# Patient Record
Sex: Male | Born: 1935 | Race: White | Hispanic: No | Marital: Single
Health system: Southern US, Community
[De-identification: ages and names within clinical notes are randomized; demographics above are authoritative.]

---

## 1998-01-20 ENCOUNTER — Inpatient Hospital Stay (HOSPITAL_COMMUNITY): Admission: AD | Admit: 1998-01-20 | Discharge: 1998-01-31 | Payer: Self-pay | Admitting: Cardiothoracic Surgery

## 2000-06-14 ENCOUNTER — Encounter: Admission: RE | Admit: 2000-06-14 | Discharge: 2000-06-14 | Payer: Self-pay | Admitting: Cardiothoracic Surgery

## 2000-06-14 ENCOUNTER — Encounter: Payer: Self-pay | Admitting: Cardiothoracic Surgery

## 2001-01-24 ENCOUNTER — Encounter: Admission: RE | Admit: 2001-01-24 | Discharge: 2001-01-24 | Payer: Self-pay | Admitting: Cardiothoracic Surgery

## 2001-01-24 ENCOUNTER — Encounter: Payer: Self-pay | Admitting: Cardiothoracic Surgery

## 2002-01-30 ENCOUNTER — Encounter: Payer: Self-pay | Admitting: Cardiothoracic Surgery

## 2002-01-30 ENCOUNTER — Encounter: Admission: RE | Admit: 2002-01-30 | Discharge: 2002-01-30 | Payer: Self-pay | Admitting: Cardiothoracic Surgery

## 2004-07-17 ENCOUNTER — Ambulatory Visit: Payer: Self-pay | Admitting: Internal Medicine

## 2008-02-19 ENCOUNTER — Ambulatory Visit: Payer: Self-pay | Admitting: Dermatology

## 2011-02-20 ENCOUNTER — Ambulatory Visit: Payer: Self-pay | Admitting: Internal Medicine

## 2012-01-24 ENCOUNTER — Ambulatory Visit: Payer: Self-pay | Admitting: Internal Medicine

## 2012-02-11 ENCOUNTER — Ambulatory Visit: Payer: Self-pay | Admitting: Urology

## 2013-01-23 ENCOUNTER — Ambulatory Visit: Payer: Self-pay

## 2013-02-11 LAB — COMPREHENSIVE METABOLIC PANEL
Albumin: 3.7 g/dL (ref 3.4–5.0)
Alkaline Phosphatase: 83 U/L (ref 50–136)
Anion Gap: 10 (ref 7–16)
BUN: 30 mg/dL — ABNORMAL HIGH (ref 7–18)
Bilirubin,Total: 0.8 mg/dL (ref 0.2–1.0)
Calcium, Total: 9.4 mg/dL (ref 8.5–10.1)
Chloride: 84 mmol/L — ABNORMAL LOW (ref 98–107)
Co2: 24 mmol/L (ref 21–32)
Creatinine: 2.4 mg/dL — ABNORMAL HIGH (ref 0.60–1.30)
EGFR (African American): 27 — ABNORMAL LOW
EGFR (Non-African Amer.): 23 — ABNORMAL LOW
Glucose: 113 mg/dL — ABNORMAL HIGH (ref 65–99)
Osmolality: 245 (ref 275–301)
Potassium: 3.8 mmol/L (ref 3.5–5.1)
SGOT(AST): 35 U/L (ref 15–37)
SGPT (ALT): 20 U/L (ref 12–78)
Sodium: 118 mmol/L — CL (ref 136–145)
Total Protein: 9 g/dL — ABNORMAL HIGH (ref 6.4–8.2)

## 2013-02-11 LAB — URINALYSIS, COMPLETE
Bilirubin,UR: NEGATIVE
Glucose,UR: NEGATIVE mg/dL (ref 0–75)
Ketone: NEGATIVE
Nitrite: NEGATIVE
Ph: 6 (ref 4.5–8.0)
Protein: 100
RBC,UR: 2238 /HPF (ref 0–5)
Specific Gravity: 1.01 (ref 1.003–1.030)
Squamous Epithelial: NONE SEEN
WBC UR: 1855 /HPF (ref 0–5)

## 2013-02-11 LAB — CBC
HCT: 40 % (ref 40.0–52.0)
HGB: 13.6 g/dL (ref 13.0–18.0)
MCH: 28.5 pg (ref 26.0–34.0)
MCHC: 34 g/dL (ref 32.0–36.0)
MCV: 84 fL (ref 80–100)
Platelet: 339 10*3/uL (ref 150–440)
RBC: 4.77 10*6/uL (ref 4.40–5.90)
RDW: 13.7 % (ref 11.5–14.5)
WBC: 13.5 10*3/uL — ABNORMAL HIGH (ref 3.8–10.6)

## 2013-02-11 LAB — TROPONIN I: Troponin-I: 0.02 ng/mL

## 2013-02-11 LAB — CK TOTAL AND CKMB (NOT AT ARMC)
CK, Total: 496 U/L — ABNORMAL HIGH (ref 35–232)
CK-MB: 10.4 ng/mL — ABNORMAL HIGH (ref 0.5–3.6)

## 2013-02-12 ENCOUNTER — Inpatient Hospital Stay: Payer: Self-pay | Admitting: Internal Medicine

## 2013-02-12 LAB — BASIC METABOLIC PANEL
Anion Gap: 5 — ABNORMAL LOW (ref 7–16)
BUN: 26 mg/dL — ABNORMAL HIGH (ref 7–18)
Calcium, Total: 8.4 mg/dL — ABNORMAL LOW (ref 8.5–10.1)
Co2: 26 mmol/L (ref 21–32)
Creatinine: 1.47 mg/dL — ABNORMAL HIGH (ref 0.60–1.30)
EGFR (African American): 49 — ABNORMAL LOW
EGFR (Non-African Amer.): 42 — ABNORMAL LOW
Glucose: 106 mg/dL — ABNORMAL HIGH (ref 65–99)
Osmolality: 255 (ref 275–301)
Potassium: 4.6 mmol/L (ref 3.5–5.1)
Sodium: 124 mmol/L — ABNORMAL LOW (ref 136–145)

## 2013-02-12 LAB — TSH: Thyroid Stimulating Horm: 1.03 u[IU]/mL

## 2013-02-13 LAB — CBC WITH DIFFERENTIAL/PLATELET
Basophil %: 0.2 %
Eosinophil #: 0.1 10*3/uL (ref 0.0–0.7)
Eosinophil %: 0.9 %
HGB: 11.7 g/dL — ABNORMAL LOW (ref 13.0–18.0)
Lymphocyte #: 0.8 10*3/uL — ABNORMAL LOW (ref 1.0–3.6)
Monocyte #: 1.1 x10 3/mm — ABNORMAL HIGH (ref 0.2–1.0)
Monocyte %: 12.7 %
Neutrophil #: 6.5 10*3/uL (ref 1.4–6.5)
Platelet: 242 10*3/uL (ref 150–440)
RBC: 3.96 10*6/uL — ABNORMAL LOW (ref 4.40–5.90)
RDW: 14 % (ref 11.5–14.5)

## 2013-02-13 LAB — URINE CULTURE

## 2013-02-13 LAB — BASIC METABOLIC PANEL WITH GFR
Anion Gap: 9
BUN: 22 mg/dL — ABNORMAL HIGH
Calcium, Total: 8.7 mg/dL
Chloride: 99 mmol/L
Co2: 23 mmol/L
Creatinine: 1.41 mg/dL — ABNORMAL HIGH
EGFR (African American): 52 — ABNORMAL LOW
EGFR (Non-African Amer.): 44 — ABNORMAL LOW
Glucose: 79 mg/dL
Osmolality: 265
Potassium: 4.1 mmol/L
Sodium: 131 mmol/L — ABNORMAL LOW

## 2013-02-14 LAB — BASIC METABOLIC PANEL
BUN: 20 mg/dL — ABNORMAL HIGH (ref 7–18)
Calcium, Total: 8.7 mg/dL (ref 8.5–10.1)
Chloride: 102 mmol/L (ref 98–107)
Co2: 25 mmol/L (ref 21–32)
Creatinine: 1.26 mg/dL (ref 0.60–1.30)
EGFR (African American): 59 — ABNORMAL LOW
EGFR (Non-African Amer.): 51 — ABNORMAL LOW
Osmolality: 270 (ref 275–301)
Potassium: 4 mmol/L (ref 3.5–5.1)

## 2013-02-15 LAB — URINALYSIS, COMPLETE
RBC,UR: 6337 /HPF (ref 0–5)
Specific Gravity: 1.018 (ref 1.003–1.030)
Squamous Epithelial: 3
WBC UR: 850 /HPF (ref 0–5)

## 2013-02-16 LAB — BASIC METABOLIC PANEL
Anion Gap: 4 — ABNORMAL LOW (ref 7–16)
BUN: 15 mg/dL (ref 7–18)
Chloride: 102 mmol/L (ref 98–107)
Creatinine: 0.94 mg/dL (ref 0.60–1.30)
EGFR (African American): >60
EGFR (Non-African Amer.): >60
Osmolality: 271 (ref 275–301)

## 2013-02-16 LAB — URINE CULTURE

## 2013-02-17 LAB — CULTURE, BLOOD (SINGLE)

## 2013-03-14 ENCOUNTER — Emergency Department: Payer: Self-pay | Admitting: Emergency Medicine

## 2013-03-30 ENCOUNTER — Ambulatory Visit: Payer: Self-pay | Admitting: Urology

## 2013-04-06 ENCOUNTER — Ambulatory Visit: Payer: Self-pay | Admitting: Urology

## 2013-09-18 ENCOUNTER — Ambulatory Visit: Payer: Self-pay

## 2013-12-02 ENCOUNTER — Ambulatory Visit: Payer: Self-pay | Admitting: Urology

## 2013-12-02 DIAGNOSIS — Z8679 Personal history of other diseases of the circulatory system: Secondary | ICD-10-CM

## 2013-12-02 LAB — CBC WITH DIFFERENTIAL/PLATELET
BASOS ABS: 0 10*3/uL (ref 0.0–0.1)
BASOS PCT: 0.7 %
Eosinophil #: 0.2 10*3/uL (ref 0.0–0.7)
Eosinophil %: 3.8 %
HCT: 34.3 % — ABNORMAL LOW (ref 40.0–52.0)
HGB: 11.1 g/dL — AB (ref 13.0–18.0)
LYMPHS ABS: 1.3 10*3/uL (ref 1.0–3.6)
LYMPHS PCT: 20.8 %
MCH: 27.3 pg (ref 26.0–34.0)
MCHC: 32.4 g/dL (ref 32.0–36.0)
MCV: 84 fL (ref 80–100)
MONOS PCT: 13.8 %
Monocyte #: 0.9 x10 3/mm (ref 0.2–1.0)
NEUTROS ABS: 3.8 10*3/uL (ref 1.4–6.5)
Neutrophil %: 60.9 %
Platelet: 255 10*3/uL (ref 150–440)
RBC: 4.07 10*6/uL — ABNORMAL LOW (ref 4.40–5.90)
RDW: 14.6 % — ABNORMAL HIGH (ref 11.5–14.5)
WBC: 6.3 10*3/uL (ref 3.8–10.6)

## 2013-12-16 ENCOUNTER — Ambulatory Visit: Payer: Self-pay | Admitting: Internal Medicine

## 2013-12-16 LAB — BASIC METABOLIC PANEL
Anion Gap: 1 — ABNORMAL LOW (ref 7–16)
BUN: 16 mg/dL (ref 7–18)
Calcium, Total: 8.5 mg/dL (ref 8.5–10.1)
Chloride: 96 mmol/L — ABNORMAL LOW (ref 98–107)
Co2: 32 mmol/L (ref 21–32)
Creatinine: 1.11 mg/dL (ref 0.60–1.30)
EGFR (African American): >60
EGFR (Non-African Amer.): 59 — ABNORMAL LOW
Glucose: 88 mg/dL (ref 65–99)
Osmolality: 260 (ref 275–301)
Potassium: 4.3 mmol/L (ref 3.5–5.1)
Sodium: 129 mmol/L — ABNORMAL LOW (ref 136–145)

## 2013-12-16 LAB — CBC WITH DIFFERENTIAL/PLATELET
Basophil #: 0 10*3/uL (ref 0.0–0.1)
Basophil %: 0.6 %
Eosinophil #: 0.2 10*3/uL (ref 0.0–0.7)
Eosinophil %: 3.8 %
HCT: 33.1 % — ABNORMAL LOW (ref 40.0–52.0)
HGB: 11.3 g/dL — ABNORMAL LOW (ref 13.0–18.0)
Lymphocyte #: 1.4 10*3/uL (ref 1.0–3.6)
Lymphocyte %: 23.4 %
MCH: 28.9 pg (ref 26.0–34.0)
MCHC: 34.3 g/dL (ref 32.0–36.0)
MCV: 84 fL (ref 80–100)
Monocyte #: 0.8 x10 3/mm (ref 0.2–1.0)
Monocyte %: 14 %
Neutrophil #: 3.5 10*3/uL (ref 1.4–6.5)
Neutrophil %: 58.2 %
Platelet: 262 10*3/uL (ref 150–440)
RBC: 3.92 10*6/uL — ABNORMAL LOW (ref 4.40–5.90)
RDW: 14.6 % — ABNORMAL HIGH (ref 11.5–14.5)
WBC: 6 10*3/uL (ref 3.8–10.6)

## 2013-12-22 ENCOUNTER — Ambulatory Visit: Payer: Self-pay | Admitting: Internal Medicine

## 2014-01-19 ENCOUNTER — Ambulatory Visit: Payer: Self-pay | Admitting: Internal Medicine

## 2014-02-12 ENCOUNTER — Ambulatory Visit: Payer: Self-pay | Admitting: Internal Medicine

## 2014-02-17 ENCOUNTER — Ambulatory Visit: Payer: Self-pay | Admitting: Internal Medicine

## 2014-02-17 LAB — CBC CANCER CENTER
BASOS PCT: 0.6 %
Basophil #: 0 x10 3/mm (ref 0.0–0.1)
EOS PCT: 5.5 %
Eosinophil #: 0.3 x10 3/mm (ref 0.0–0.7)
HCT: 33.9 % — ABNORMAL LOW (ref 40.0–52.0)
HGB: 11.5 g/dL — ABNORMAL LOW (ref 13.0–18.0)
Lymphocyte #: 0.9 x10 3/mm — ABNORMAL LOW (ref 1.0–3.6)
Lymphocyte %: 16.3 %
MCH: 28.6 pg (ref 26.0–34.0)
MCHC: 34 g/dL (ref 32.0–36.0)
MCV: 84 fL (ref 80–100)
Monocyte #: 0.8 x10 3/mm (ref 0.2–1.0)
Monocyte %: 14.5 %
NEUTROS ABS: 3.3 x10 3/mm (ref 1.4–6.5)
NEUTROS PCT: 63.1 %
Platelet: 289 x10 3/mm (ref 150–440)
RBC: 4.03 10*6/uL — AB (ref 4.40–5.90)
RDW: 14 % (ref 11.5–14.5)
WBC: 5.3 x10 3/mm (ref 3.8–10.6)

## 2014-02-17 LAB — COMPREHENSIVE METABOLIC PANEL
ALBUMIN: 2.9 g/dL — AB (ref 3.4–5.0)
ALK PHOS: 71 U/L
ALT: 13 U/L (ref 12–78)
ANION GAP: 5 — AB (ref 7–16)
BILIRUBIN TOTAL: 0.6 mg/dL (ref 0.2–1.0)
BUN: 17 mg/dL (ref 7–18)
CALCIUM: 8.7 mg/dL (ref 8.5–10.1)
CHLORIDE: 91 mmol/L — AB (ref 98–107)
Co2: 30 mmol/L (ref 21–32)
Creatinine: 1.35 mg/dL — ABNORMAL HIGH (ref 0.60–1.30)
EGFR (African American): 54 — ABNORMAL LOW
GFR CALC NON AF AMER: 47 — AB
Glucose: 102 mg/dL — ABNORMAL HIGH (ref 65–99)
Osmolality: 255 (ref 275–301)
POTASSIUM: 4.4 mmol/L (ref 3.5–5.1)
SGOT(AST): 20 U/L (ref 15–37)
SODIUM: 126 mmol/L — AB (ref 136–145)
TOTAL PROTEIN: 8.7 g/dL — AB (ref 6.4–8.2)

## 2014-02-17 LAB — LACTATE DEHYDROGENASE: LDH: 154 U/L (ref 85–241)

## 2014-02-17 LAB — PROTIME-INR
INR: 1
PROTHROMBIN TIME: 13.5 s (ref 11.5–14.7)

## 2014-02-17 LAB — APTT: Activated PTT: 34.9 secs (ref 23.6–35.9)

## 2014-02-26 ENCOUNTER — Ambulatory Visit: Payer: Self-pay | Admitting: Internal Medicine

## 2014-03-01 ENCOUNTER — Ambulatory Visit: Payer: Self-pay | Admitting: Internal Medicine

## 2014-03-18 LAB — PATHOLOGY REPORT

## 2014-03-31 ENCOUNTER — Ambulatory Visit: Payer: Self-pay | Admitting: Internal Medicine

## 2014-04-28 ENCOUNTER — Inpatient Hospital Stay: Payer: Self-pay | Admitting: Internal Medicine

## 2014-04-28 LAB — COMPREHENSIVE METABOLIC PANEL
AST: 25 U/L (ref 15–37)
Albumin: 2.7 g/dL — ABNORMAL LOW (ref 3.4–5.0)
Alkaline Phosphatase: 76 U/L
Anion Gap: 8 (ref 7–16)
BUN: 13 mg/dL (ref 7–18)
Bilirubin,Total: 0.9 mg/dL (ref 0.2–1.0)
CHLORIDE: 79 mmol/L — AB (ref 98–107)
CO2: 27 mmol/L (ref 21–32)
Calcium, Total: 8.2 mg/dL — ABNORMAL LOW (ref 8.5–10.1)
Creatinine: 0.94 mg/dL (ref 0.60–1.30)
EGFR (Non-African Amer.): >60
Glucose: 96 mg/dL (ref 65–99)
OSMOLALITY: 231 (ref 275–301)
Potassium: 3.9 mmol/L (ref 3.5–5.1)
SGPT (ALT): 18 U/L
SODIUM: 114 mmol/L — AB (ref 136–145)
Total Protein: 8.3 g/dL — ABNORMAL HIGH (ref 6.4–8.2)

## 2014-04-28 LAB — URINALYSIS, COMPLETE
BACTERIA: NONE SEEN
Bilirubin,UR: NEGATIVE
Glucose,UR: NEGATIVE mg/dL (ref 0–75)
Ketone: NEGATIVE
Nitrite: NEGATIVE
PH: 6 (ref 4.5–8.0)
Protein: 100
SPECIFIC GRAVITY: 1.006 (ref 1.003–1.030)
SQUAMOUS EPITHELIAL: NONE SEEN

## 2014-04-28 LAB — CBC WITH DIFFERENTIAL/PLATELET
BASOS ABS: 0 10*3/uL (ref 0.0–0.1)
Basophil %: 0.3 %
Eosinophil #: 0 10*3/uL (ref 0.0–0.7)
Eosinophil %: 0.6 %
HCT: 27.3 % — AB (ref 40.0–52.0)
HGB: 9 g/dL — ABNORMAL LOW (ref 13.0–18.0)
Lymphocyte #: 0.6 10*3/uL — ABNORMAL LOW (ref 1.0–3.6)
Lymphocyte %: 8.8 %
MCH: 27.4 pg (ref 26.0–34.0)
MCHC: 33 g/dL (ref 32.0–36.0)
MCV: 83 fL (ref 80–100)
MONO ABS: 0.7 x10 3/mm (ref 0.2–1.0)
MONOS PCT: 10.1 %
Neutrophil #: 5.8 10*3/uL (ref 1.4–6.5)
Neutrophil %: 80.2 %
Platelet: 322 10*3/uL (ref 150–440)
RBC: 3.28 10*6/uL — ABNORMAL LOW (ref 4.40–5.90)
RDW: 14.9 % — ABNORMAL HIGH (ref 11.5–14.5)
WBC: 7.2 10*3/uL (ref 3.8–10.6)

## 2014-04-28 LAB — SODIUM: SODIUM: 115 mmol/L — AB (ref 136–145)

## 2014-04-28 LAB — TSH: Thyroid Stimulating Horm: 6.65 u[IU]/mL — ABNORMAL HIGH

## 2014-04-29 LAB — BASIC METABOLIC PANEL
Anion Gap: 7 (ref 7–16)
BUN: 11 mg/dL (ref 7–18)
CALCIUM: 7.8 mg/dL — AB (ref 8.5–10.1)
CO2: 29 mmol/L (ref 21–32)
CREATININE: 0.93 mg/dL (ref 0.60–1.30)
Chloride: 82 mmol/L — ABNORMAL LOW (ref 98–107)
Glucose: 88 mg/dL (ref 65–99)
Osmolality: 237 (ref 275–301)
Potassium: 4.1 mmol/L (ref 3.5–5.1)
Sodium: 118 mmol/L — CL (ref 136–145)

## 2014-04-29 LAB — CBC WITH DIFFERENTIAL/PLATELET
BASOS ABS: 0 10*3/uL (ref 0.0–0.1)
Basophil %: 0.3 %
Eosinophil #: 0.1 10*3/uL (ref 0.0–0.7)
Eosinophil %: 1.1 %
HCT: 26 % — ABNORMAL LOW (ref 40.0–52.0)
HGB: 8.6 g/dL — ABNORMAL LOW (ref 13.0–18.0)
LYMPHS PCT: 6.1 %
Lymphocyte #: 0.4 10*3/uL — ABNORMAL LOW (ref 1.0–3.6)
MCH: 27.6 pg (ref 26.0–34.0)
MCHC: 33.1 g/dL (ref 32.0–36.0)
MCV: 83 fL (ref 80–100)
MONOS PCT: 11.2 %
Monocyte #: 0.8 x10 3/mm (ref 0.2–1.0)
NEUTROS PCT: 81.3 %
Neutrophil #: 5.7 10*3/uL (ref 1.4–6.5)
PLATELETS: 287 10*3/uL (ref 150–440)
RBC: 3.12 10*6/uL — ABNORMAL LOW (ref 4.40–5.90)
RDW: 15.2 % — ABNORMAL HIGH (ref 11.5–14.5)
WBC: 7.1 10*3/uL (ref 3.8–10.6)

## 2014-04-30 LAB — URINE CULTURE

## 2014-08-10 IMAGING — CT CT ABDOMEN AND PELVIS WITHOUT AND WITH CONTRAST
2 of 4 series · 14 of 32 positions shown, 19 images · non-contrast
Comparison: No comparison

REASON FOR EXAM: Hematuria
COMMENTS:

PROCEDURE:     CT  - CT ABDOMEN / PELVIS  W/WO  - January 23, 2013 [DATE]
RESULT:     History: Hematuria
TECHNIQUE: Multiple axial images of the abdomen and pelvis were performed
from the lung bases to the pubic symphysis, without p.o. contrast and prior
to and following 100 ml of Bsovue-PWI intravenous contrast in the
nephrographic and excretory renal phases.

[Series 2: soft tissue w/o · axial · non-contrast · 0.75mm/px · z∈[-779,-473]mm · 8 of 132 slices shown, 13 images]
[im 15/132  soft-tissue]
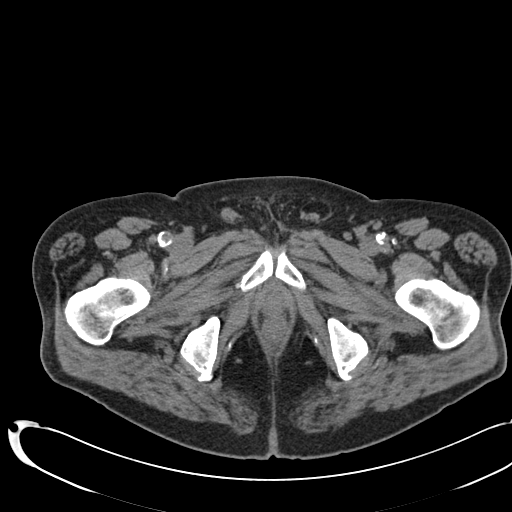
[im 15/132  bone]
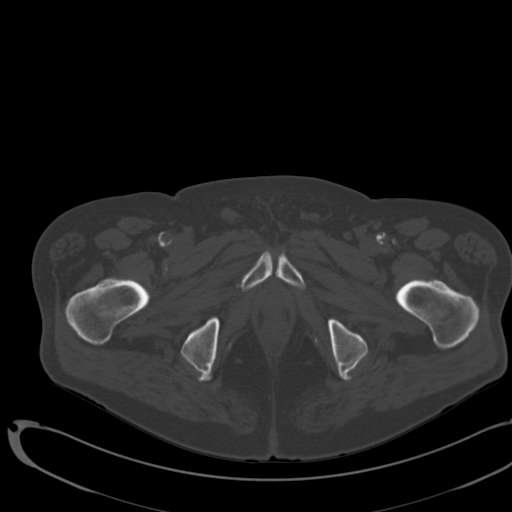
[im 30/132  soft-tissue]
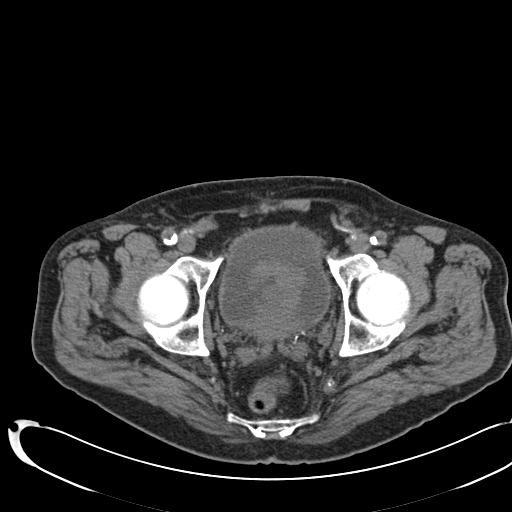
[im 44/132  soft-tissue]
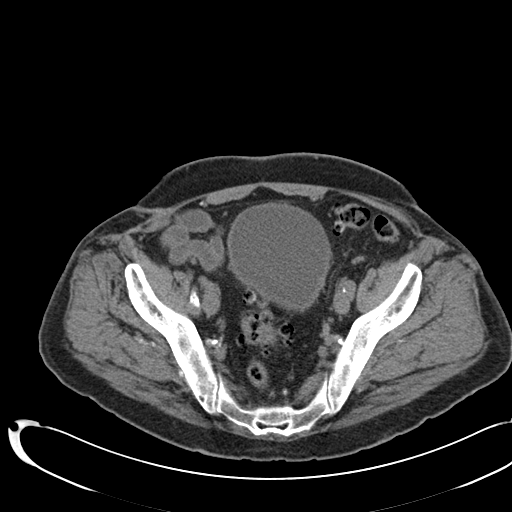
[im 59/132  soft-tissue]
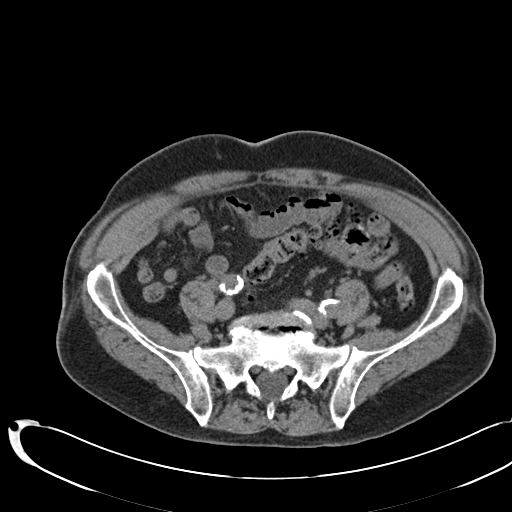
[im 73/132  soft-tissue]
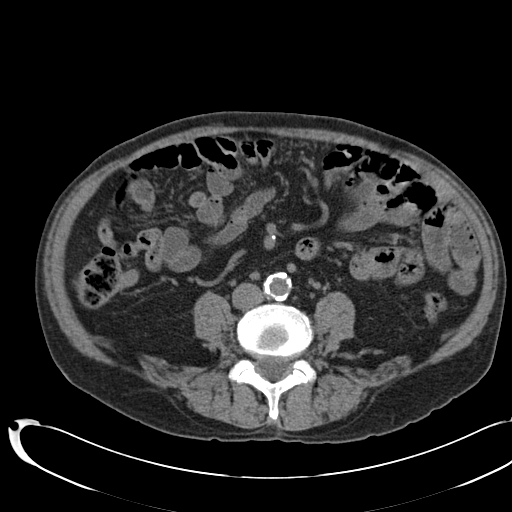
[im 73/132  lung]
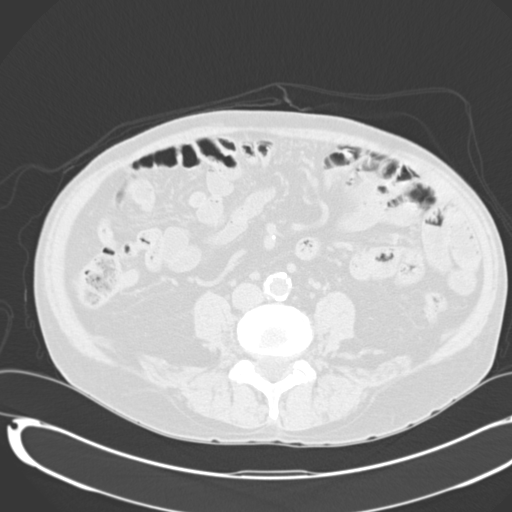
[im 88/132  soft-tissue]
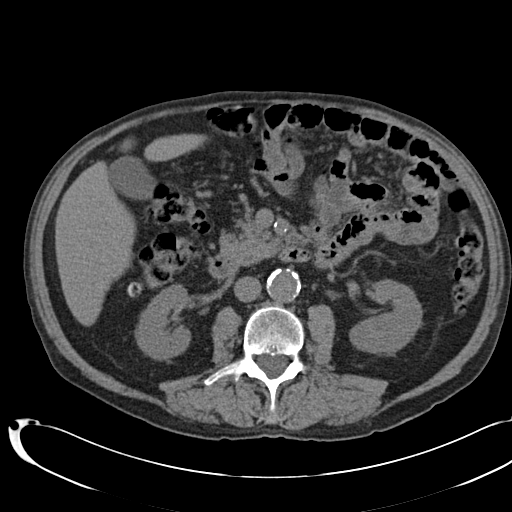
[im 88/132  lung]
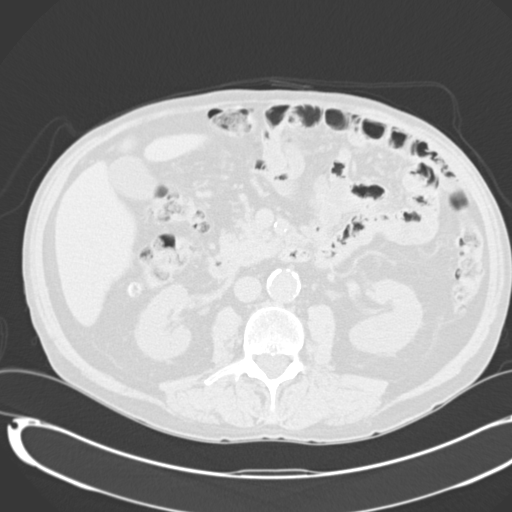
[im 102/132  soft-tissue]
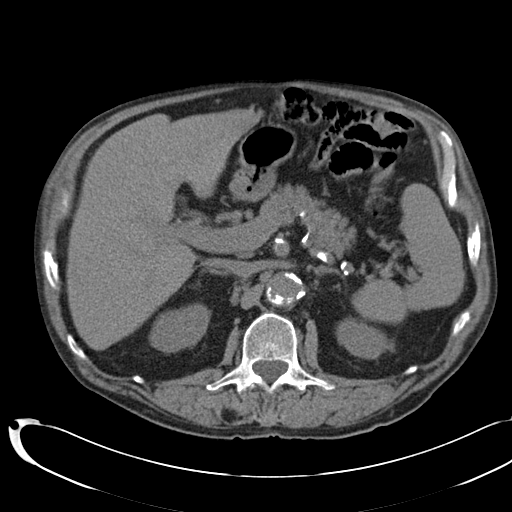
[im 102/132  lung]
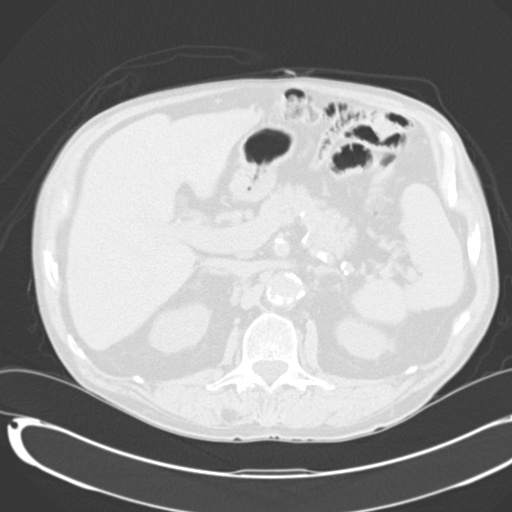
[im 117/132  soft-tissue]
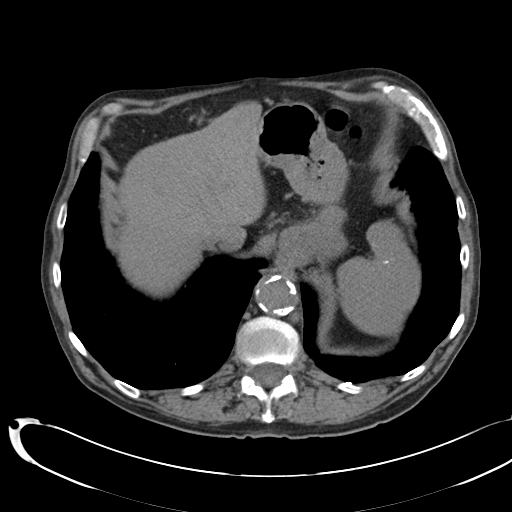
[im 117/132  lung]
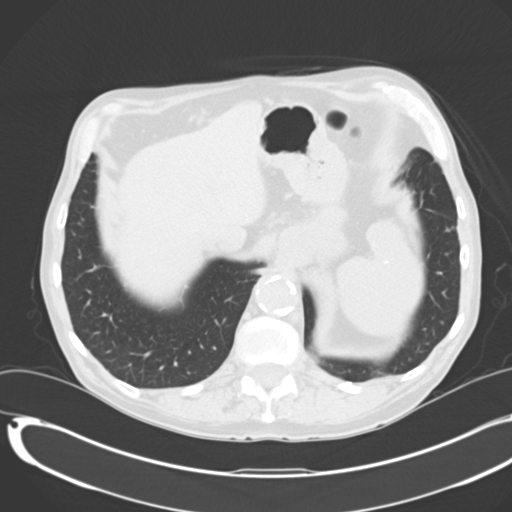

[Series 4: soft tissue with · axial · 0.75mm/px · z∈[-779,-560]mm · 6 of 132 slices shown]
[im 15/132  soft-tissue]
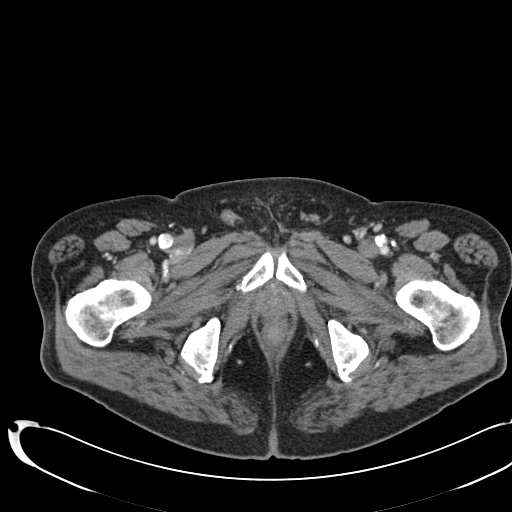
[im 30/132  soft-tissue]
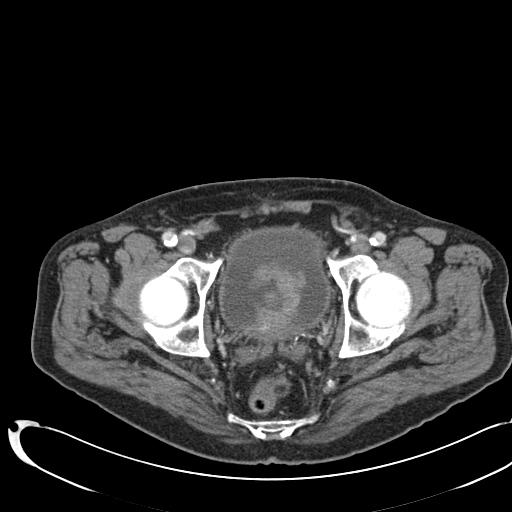
[im 44/132  soft-tissue]
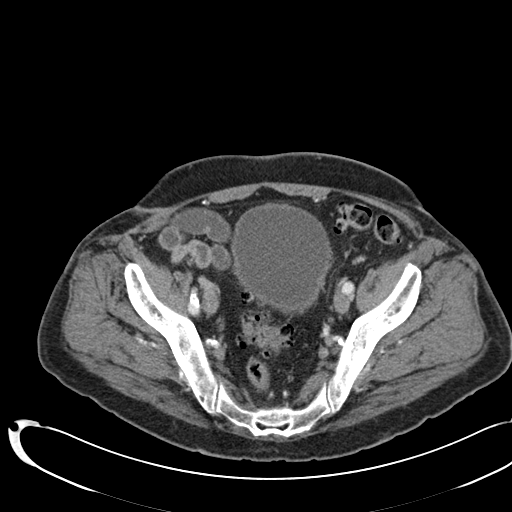
[im 59/132  soft-tissue]
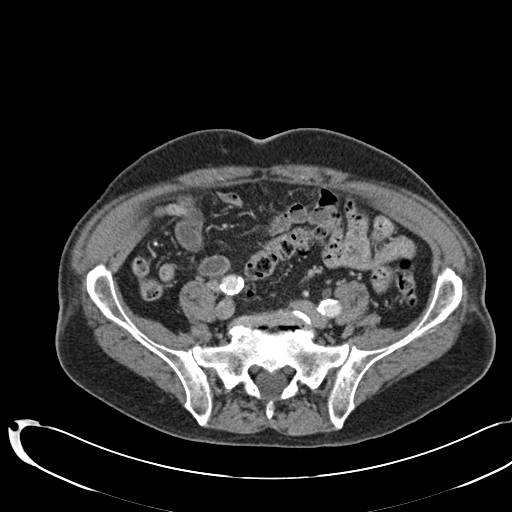
[im 73/132  soft-tissue]
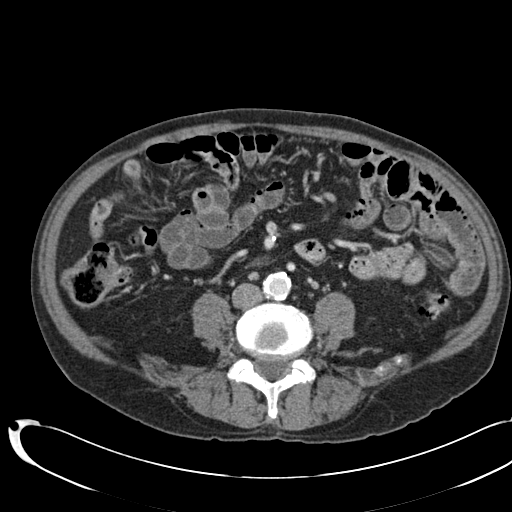
[im 88/132  soft-tissue]
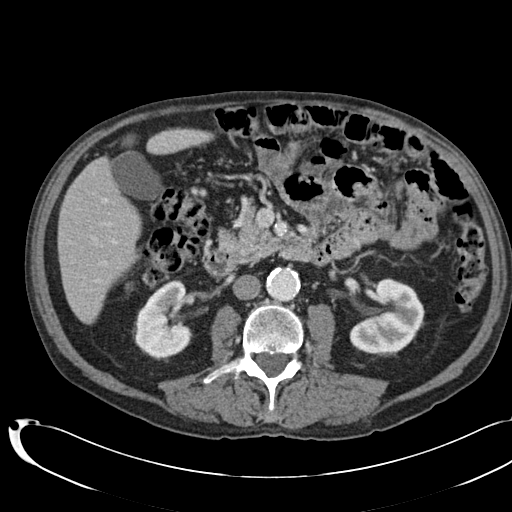

[14 of 32 positions shown; findings below may reference images not displayed]

FINDINGS: The lung bases are clear. There is no pneumothorax. The heart size is
normal. There is coronary artery atherosclerosis.

There are no renal, ureteral, or bladder calculi. There is no obstructive
uropathy. There is no cystic or solid renal mass. Bilateral kidneys enhance
normally and symmetrically. There is normal symmetric bilateral excretion of
contrast without evidence of filling defects. The bladder is unremarkable.
There is prostatic enlargement.

The liver demonstrates no focal abnormality. There is no intrahepatic or
extrahepatic biliary ductal dilatation. The gallbladder is unremarkable. The
spleen demonstrates no focal abnormality. The  adrenal glands, and pancreas
are normal.

The unopacified stomach, duodenum, small intestine, and large intestine
demonstrate no gross abnormality, but evaluation is limited secondary to
lack of enteric contrast. There is diverticulosis without evidence of
diverticulitis. There is no pneumoperitoneum, pneumatosis, or portal venous
gas. There is no abdominal or pelvic free fluid. There is no
lymphadenopathy.

The abdominal aorta is normal in caliber.

There is lumbar spine spondylosis.
IMPRESSION: 1. No evidence of a renal or bladder mass.

2. Prostatic enlargement.

[REDACTED]

## 2015-01-21 NOTE — Op Note (Signed)
PATIENT NAME:  Jacob MacadamDAVIS, Jacob Ayala MR#:  [id number] DATE OF BIRTH:  Jul 08, 1923  DATE OF PROCEDURE:  04/06/2013  PREOPERATIVE DIAGNOSIS:  Possible bladder tumor and BPH with retention.  POSTOPERATIVE DIAGNOSIS:  Possible bladder tumor and BPH with retention.  PROCEDURE:  KTP laser of the prostatic fossa.  ESTIMATED BLOOD LOSS:  Zero.   COMPLICATIONS: None.  DRAINS:  Foley.  DESCRIPTION OF PROCEDURE: With the patient sterilely prepped and draped in supine lithotomy position for ease of approach to external genitalia, and good relaxation from a general anesthetic, I begin the procedure. The urethra and bladder are viewed in its entirety. He has had an indwelling Foley for some time. There is quite a bit of necrotic debris in the prostatic fossa. There appears to be a small bladder tumor in the prostatic fossa, so I used the KTP laser to destroy that and destroy any debris, and some prostatic enlargement in the prostatic fossa. Bleeding is minimal. KTP laser is swept from side to side and back and forth in the prostatic fossa. Creates quite a bit of necrosis. The patient tolerates the procedure well. (Dictation Anomaly) crede of a full bladder at the end of the procedure reveals an excellent urinary stream. Then a 22 [place] Foley is placed in the bladder, the balloon is blown  up to 10 mL within the bladder, not the prostatic fossa. There is no bleeding from the prostate. He is sent to recovery in satisfactory condition, and will be discharged home today.     ____________________________ Caralyn Guileichard D. Edwyna ShellHart, DO rdh:mr D: 04/06/2013 14:06:23 ET T: 04/06/2013 20:44:37 ET JOB#: [id number]  cc: Caralyn Guileichard D. Edwyna ShellHart, DO, <Dictator> RICHARD D HART DO ELECTRONICALLY SIGNED 04/24/2013 16:35

## 2015-01-21 NOTE — H&P (Signed)
PATIENT NAME:  Jacob MacadamDAVIS, Jacob Ayala MR#:  [id number] DATE OF BIRTH:  [birth year]  DATE OF ADMISSION:  02/12/2013  PRIMARY CARE PHYSICIAN:  Dr. Juel BurrowMasoud.  REFERRING EMERGENCY ROOM PHYSICIAN:  Dr. Bayard Malesandolph Brown.  HISTORY OF PRESENT ILLNESS:  The patient is an 81 year old male with history of hypertension, cholesterol, atrial fibrillation brought to the Emergency Room by his friend for some kind of sickness.  The patient is a little confused and is not able to give me detailed history of his presentation or sickness.  He is a poor historian, either this is a confusion or old age issue, but whatever history he gave me is he was diagnosed with urine infection last week.  He had some problem with his urine which is he has to strain with the urine and it comes very slow.  So last week he saw his primary care doctor and he gave him some prescription, says that he was started on some antibiotic by his primary care physician, but does not remember the name of the antibiotic and he is not able to give me any detailed history or any other complaint why he came to the Emergency Room today.  On further evaluation by ER physician he was found having hyponatremia and UTI with acute renal failure, as patient is unaware about any renal failure in the past.   REVIEW OF SYSTEMS:  Unable to get as the patient is a little confused.   PAST MEDICAL HISTORY:  As per the patient, I do not know how much is reliable, hypertension, cholesterol, atrial fibrillation.   PAST SURGICAL HISTORY:  None.   SOCIAL HISTORY:  No smoking.  Alcohol, sometimes beer.  He lives alone at home.   FAMILY HISTORY:  Brother had a stroke at age of 79 years, a few months ago.   HOME MEDICATIONS:  The patient is not sure about what medicines he is taking at home.  PHYSICAL EXAMINATION: VITAL SIGNS:  In the ER, pulse rate 90, respirations 18, blood pressure 110/55, came to 139/65. GENERAL:  He is fully alert, but not very well oriented to the situation.   He knows he is in the hospital and other events, but is not able to give me a good history about what happened to bring him to the hospital.  HEENT:  Head and neck atraumatic.  Conjunctivae pink.  Oral mucosa moist.  NECK:  Supple.  No JVD.  RESPIRATORY:  Bilateral clear and equal air entry.  CARDIOVASCULAR:  S1, S2 present.  Irregular.  No murmur appreciated.  ABDOMEN:  Soft, nontender.  Bowel sounds present.  No organomegaly.  SKIN:  No rashes.  JOINTS:  No edema, swelling or tenderness.  LEGS:  No edema.  NEUROLOGICAL:  Power 5 out of 5.  Moves all four limbs.  No tremors.   IMPORTANT LABORATORY RESULTS:  Glucose 113, BUN 30, creatinine 2.40, sodium 118, potassium 3.8, chloride 84, total protein 9.0, bilirubin 0.8, alkaline phosphate 83, SGOT 35, SGPT 20.  Troponin 0.02.  WBC 13.5 thousand.  Urinalysis, RBCs 2238 and WBCs 1855, 3+ bacteria and 2+ leukocyte esterase.  CT of the head without contrast, changes of atrophy with chronic microvascular changes, old small left occipital infarct.  No acute findings.   ASSESSMENT AND PLAN:  An 81 year old male with history of hypertension, cholesterol, and atrial fibrillation, came with some confusion, hyponatremia and urinary tract infection with acute renal failure.  1.  Hyponatremia plus acute renal failure, sodium is 118.  The patient does not  have a baseline creatinine level, so we will give intravenous fluids normal saline 150 mL/hr.  Monitor his BMP.  2.  Urinary tract infection.  As he says that he was taking some antibiotics orally by primary care physician, I will start him on Rocephin and send urine culture for further decision-making.  3.  Hypertension.  Currently blood pressure is stable, so does not need any medicine at this time.  4.  Atrial fibrillation, due to old age, not a good candidate for any anticoagulation at this time.  We will have to get more history from his primary care physician if this atrial fibrillation is new or old.    Total time spent in this admission 50 minutes.     ____________________________ Jacob Ayala Elisabeth Pigeon, MD vgv:ea D: 02/12/2013 00:17:05 ET T: 02/12/2013 02:03:08 ET JOB#: [id number]  cc: Jacob Ayala. Elisabeth Pigeon, MD, <Dictator> Altamese Dilling MD ELECTRONICALLY SIGNED 02/12/2013 6:24

## 2015-01-21 NOTE — Discharge Summary (Signed)
PATIENT NAME:  Jacob Ayala, Jacob Ayala MR#:  [id number] DATE OF BIRTH:  07/29/1923  DATE OF ADMISSION:  02/12/2013 DATE OF DISCHARGE:  02/16/2013  PRIMARY CARE PHYSICIAN: Corky Downs, MD    DISCHARGE DIAGNOSES:  1.  Metabolic encephalopathy.  2.  Hematuria, benign prostatic hypertrophy and urinary retention.  3.  Hyponatremia.  4.  Acute renal failure.  5.  Hypertension.  6.  Weakness.   MEDICATIONS ON DISCHARGE: Coreg 12.5 mg twice a day, Flomax 0.4 mg daily, Colace 100 mg 1 capsule twice a day as needed for constipation, cephalexin 500 mg 1 tab every 8 hours for seven days.   REFERRAL:  Home health with physical therapy and nurse.   TREATMENT: Foley to leg bag.   DIET: Low sodium diet, regular consistency.   ACTIVITY: As tolerated.   FOLLOWUP:  With Dr. Lonna Cobb in around 10 days, 1 to 2 weeks with Dr. Juel Burrow.   REASON FOR ADMISSION: The patient was admitted 02/12/2013, discharged 02/16/2013. The patient came in with confusion, problem with his urine coming out very slow, was started on an antibiotic. He was found to have hyponatremia, possible urinary tract infection and he was in atrial fibrillation.   LABORATORY AND RADIOLOGICAL DATA DURING HOSPITAL COURSE INCLUDED:  An EKG that showed atrial fibrillation, incomplete right bundle branch block. CT scan of the head showed changes of atrophy and chronic microvascular ischemic disease, old small left occipital infarct. Chest x-ray showed no acute cardiopulmonary disease. Ultrasound of the kidney: Minimal hydronephrosis. Large postvoid residual of urinary bladder volume, approximately 950 mL  Urine culture grew out 60,000 coag-negative staph. Urinalysis showed 3+ blood, 2+ leukocyte esterase, 3+ bacteria. Troponin negative. Glucose 113, BUN 30, creatinine 2.4, sodium 118, potassium 3.8, chloride 84, CO2 24, calcium 9.4. Liver function tests: Total protein elevated at 9.0. GFR 23. White blood cell count 13.5, H and H 13.6 and 40.0, platelet count  of 339.  Blood culture is negative. TSH 1.03., Creatinine the next day improved to 1.47, sodium up to 124, sodium the next day 131, creatinine 1.4. Repeat urine culture on the 18th negative. Urinalysis 3+, bacteria 6337, red blood cells on May 18th. Repeat hemoglobin 11.6. Last sodium 135, last creatinine 0.94.   HOSPITAL COURSE PER PROBLEM LIST:  1.  The patient's encephalopathy:  This has improved. I think this was likely secondary to the hyponatremia.  The patient was answering questions appropriately and taking instructions onto how to monitor the urine and drain the urine Foley bag.  2.  Hematuria, benign prostatic hypertrophy and urinary retention:  The patient was without a Foley the entire hospitalization.  On May 18th, he started developing very dark urine, not much coming out. A renal ultrasound showed a large residual. This was repeated in the morning on May 19th that showed 999 postvoid residual after urination. A Foley catheter was placed, and  1200 urine did come out.  Flomax was started. The case was discussed with Dr. Lonna Cobb, who stated that the Foley catheter needs to stay in for at least 10 days, and he will take it out in the office, could need cystoscopy as outpatient.  3.  Hyponatremia:  This had improved with IV fluid hydration.  Sodium close to the normal range.  4.  Acute renal failure:  This had improved with IV fluid hydration even before the Foley catheter was placed. Creatinine upon discharge normal range. Lisinopril was held.  5.  Hypertension:  Blood pressure controlled, 143/64 upon discharge.  6.  Weakness:  He  did walk well with physical therapy.  7.  Atrial fibrillation: Unclear if old or new.  Can follow up with Dr. Juel BurrowMasoud for this. I did stop the blood thinners, aspirin and heparin subcutaneous since the patient developed hematuria while here. Hemoglobin is 11.6 upon discharge, which is not much different from the day before but different from admission when the patient  was dehydrated. No blood thinners at this point for the atrial fibrillation:   TIME SPENT ON DISCHARGE: 40 minutes.  I had to go back and talk with the patient after I already saw him in the morning.   ____________________________ Herschell Dimesichard J. Renae GlossWieting, MD rjw:cb D: 02/16/2013 16:02:57 ET T: 02/16/2013 22:18:11 ET JOB#: [id number]  cc: Herschell Dimesichard J. Renae GlossWieting, MD, <Dictator> Corky DownsJaved Masoud, MD Scott C. Lonna CobbStoioff, MD Salley ScarletICHARD J [place] Ice MD ELECTRONICALLY SIGNED 02/20/2013 13:30

## 2015-01-22 NOTE — H&P (Signed)
PATIENT NAME:  Jacob Ayala, Jacob Ayala MR#:  [id number] DATE OF BIRTH:  [birth year]  DATE OF ADMISSION:  04/28/2014  PRIMARY CARE PHYSICIAN: Dr. Juel Burrow.   CHIEF COMPLAINT: Hematuria.   HISTORY OF PRESENT ILLNESS: An 81 year old male who was recently diagnosed with stage IV bladder cancer. He was followed by hospice who presents with the above complaint. The patient also has weakness. His power of attorney, who is at bedside, Jaclynn Guarneri, said the patient was actually trying to go to hospice home, but there have been no beds. For his cancer, since it is stage IV, they are not doing any treatment.   REVIEW OF SYSTEMS:  CONSTITUTIONAL: No fever. Positive fatigue, weakness. He says he feels terrible. EYES: No glaucoma.  EARS, NOSE AND THROAT: Positive hearing loss. No snoring, postnasal drip.  RESPIRATORY: No cough, wheezing, hemoptysis.  CARDIOVASCULAR: No chest, palpitations, syncope, or shortness of breath.   GASTROINTESTINAL: Positive nausea. No vomiting, or diarrhea. Positive lower abdominal pain. No melena or ulcers.  GENITOURINARY: Positive for hematuria.  ENDOCRINE: No nocturia.  HEMATOCRIT AND LYMPH: Positive anemia and easy bruising.   SKIN: No rash or lesions.  MUSCULOSKELETAL: Positive generalized weakness.   NEUROLOGIC:  No CVA or TIA.  PSYCHIATRIC: No history of anxiety or depression.  PAST MEDICAL HISTORY:  1. Stage IV bladder cancer.  2. History of CVA and stroke.  3. Atrial fibrillation. 4. Hypertension.   PAST SURGICAL HISTORY:  1. Appendectomy. 2. Bypass.   SOCIAL HISTORY: No tobacco, alcohol or drug use.   ALLERGIES:  No known drug allergies.   MEDICATIONS:  1. Tamsulosin 0.4 mg daily. 2. Simvastatin 20 mg daily.  3. Norco 5/325 every 6 hours p.r.n.  4. Meclizine 25 mg daily.  5. Coreg 12.5 b.i.d.  6. Aspirin 81 mg daily.   FAMILY HISTORY: No history of cancer.   PHYSICAL EXAMINATION:  VITAL SIGNS: Temperature 98, pulse 79, respirations 20, blood pressure 10668,  98% on room air.  GENERAL: The patient is critically ill-appearing, not in acute distress. Keeps saying that he feels terrible.  HEENT: Head is atraumatic. Pupils are round, sclerae anicteric. Mucous membranes are dry. Oropharynx: Clear.  NECK: Supple without JVD, carotid bruit.   CARDIOVASCULAR: Tachycardia. No murmurs, gallops, or rubs. PMI is not is not displaced.  RESPIRATORY: Lungs clear to auscultation without crackles, rales, rhonchi or wheezing or percussion.  ABDOMEN: Bowel sounds are positive. Nontender, nondistended. No hepatosplenomegaly.  EXTREMITIES: He has 2+ pitting edema bilaterally.  NEUROLOGIC: Cranial nerves II-XII are grossly intact. There are no focal deficits. He does have generalized weakness.   LABORATORY DATA: Sodium 114, potassium 3.9 chloride 79, bicarbonate 27, BUN 13, creatinine 0.94 glucose is 96. Total protein 8.3, albumin 2.7, bilirubin 0.9, alkaline phosphatase 76, AST 25, ALT 18, white blood cells 7.2, hemoglobin 9, hematocrit 28 and platelets 322,000.   EKG shows atrial fibrillation. No ST elevation or depression.   ASSESSMENT AND PLAN: An 81 year old male with a history of stage IV bladder cancer, presents with hematuria severe hypernatremia.   1. Hematuria secondary to bladder cancer stage IV. Patient's power of attorney, Jaclynn Guarneri, opted to place the patient on comfort care. No further work-up at this time except for comfort care.  2. Hyponatremia likely secondary to his bladder cancer with possible component of prerenal azotemia as well. The patient's power of attorney, Jaclynn Guarneri, does not want aggressive measures. No IV fluids. The patient will be on comfort care.  3. History of atrial fibrillation. The patient is currently on  aspirin, but will hold this due to his hematuria. Continue Coreg.  4. Urinary tract infection. We can treat this with Rocephin for now until the patient will be discharged to hospice home.  5. Stage IV bladder cancer. The patient  is with hospice and plan is for hospice home. I have consulted case management for hospice screening and also palliative care. The patient is a do not resuscitate .   The patient again is comfort care. Discussed with the POA, Jaclynn Guarneriuth Brooks.   TIME SPENT: Approximately 50 minutes.   ____________________________ Janyth ContesSital P. Juliene PinaMody, MD spm:jh D: 04/28/2014 18:46:10 ET T: 04/28/2014 19:05:26 ET JOB#: [id number]  cc: Collin Hendley P. Juliene PinaMody, MD, <Dictator> Janyth ContesSITAL P Rosalina Dingwall MD ELECTRONICALLY SIGNED 04/29/2014 11:33

## 2015-01-22 NOTE — Discharge Summary (Signed)
PATIENT NAME:  Jacob Ayala, Jacob Ayala MR#:  [id number] DATE OF BIRTH:  09-07-1923  DATE OF ADMISSION:  04/28/2014 DATE OF DISCHARGE:  04/30/2014  PRESENTING COMPLAINT: Generalized weakness, inability to get around.   DISCHARGE DIAGNOSES: 1.  Stage IV bladder cancer.  2.  Failure to thrive.   DISPOSITION: The patient is being discharged to the hospice home.   DISCHARGE MEDICATIONS: 1.  Oxycodone 20 mg/mL 0.5 every 1 to 2 hours as needed.  2.  Lorazepam 0.5 mg 1 tablet every 2 to 4 hours.  3.  Morphine 20 mg/mL 0.5 mL every 1 to 2 hours.  4.  Zofran ODT 1 tablet every 6 hours as needed.   CODE STATUS: NO CODE, DNR.   BRIEF SUMMARY OF HOSPITAL COURSE: Mr. Earlene PlaterDavis is an 81 year old Caucasian gentleman who has history of stage IV bladder cancer, came in with hematuria, severe hyponatremia and severe generalized weakness. He was admitted with hematuria secondary to recurrent bladder stage IV cancer. The patient has severe hyponatremia secondary to prerenal azotemia and history of atrial fibrillation. The patient's POA is Jacob Ayala who opted to place the patient on comfort care. The patient was agreeable to it. No work-up was initiated. He received some IV fluids for hydration. Hospital stay otherwise remained stable. The patient has poor prognosis and is being discharged to the hospice home.   TIME SPENT: 40 minutes.   ____________________________ Wylie HailSona A. Allena KatzPatel, MD sap:sb D: 04/30/2014 14:17:16 ET T: 04/30/2014 14:32:12 ET JOB#: [id number]  cc: Ramey Schiff A. Allena KatzPatel, MD, <Dictator> Willow OraSONA A Petra Sargeant MD ELECTRONICALLY SIGNED 05/12/2014 11:39
